# Patient Record
Sex: Female | Born: 1990 | Hispanic: Yes | Marital: Married | Smoking: Never smoker
Health system: Southern US, Community
[De-identification: ages and names within clinical notes are randomized; demographics above are authoritative.]

---

## 2019-02-09 ENCOUNTER — Other Ambulatory Visit (HOSPITAL_COMMUNITY): Payer: Self-pay | Admitting: *Deleted

## 2019-02-09 DIAGNOSIS — N644 Mastodynia: Secondary | ICD-10-CM

## 2019-03-26 ENCOUNTER — Telehealth (HOSPITAL_COMMUNITY): Payer: Self-pay | Admitting: *Deleted

## 2019-03-26 NOTE — Telephone Encounter (Signed)
Telephoned patient at home number. Confirmed appointment for March 31. No symptoms of COVID-19. No travel outside of [place] in last 14 days. No contact with someone with a confirmed diagnosis of COVID-19. Used interpreter Julie Sowell.   

## 2019-03-27 ENCOUNTER — Other Ambulatory Visit: Payer: Self-pay

## 2019-03-27 ENCOUNTER — Ambulatory Visit (HOSPITAL_COMMUNITY)
Admission: RE | Admit: 2019-03-27 | Discharge: 2019-03-27 | Disposition: A | Discharge status: Home / Self Care | Payer: Self-pay | Source: Ambulatory Visit | Attending: Obstetrics and Gynecology | Admitting: Obstetrics and Gynecology

## 2019-03-27 ENCOUNTER — Ambulatory Visit
Admission: RE | Admit: 2019-03-27 | Discharge: 2019-03-27 | Disposition: A | Discharge status: Home / Self Care | Payer: No Typology Code available for payment source | Source: Ambulatory Visit | Attending: Obstetrics and Gynecology | Admitting: Obstetrics and Gynecology

## 2019-03-27 ENCOUNTER — Encounter (HOSPITAL_COMMUNITY): Payer: Self-pay

## 2019-03-27 ENCOUNTER — Other Ambulatory Visit (HOSPITAL_COMMUNITY): Payer: Self-pay | Admitting: *Deleted

## 2019-03-27 VITALS — BP 102/64 | Temp 97.9°F | Wt 133.0 lb

## 2019-03-27 DIAGNOSIS — Z1239 Encounter for other screening for malignant neoplasm of breast: Secondary | ICD-10-CM

## 2019-03-27 DIAGNOSIS — N644 Mastodynia: Secondary | ICD-10-CM

## 2019-03-27 NOTE — Patient Instructions (Signed)
Explained breast self awareness with Carla Salazar. Patient did not need a Pap smear today due to last Pap smear was in May 2018 per patient. Let her know [facility] will cover Pap smears every 3 years unless has a history of abnormal Pap smears. Referred patient to the [facility] for a bilateral breast ultrasound. Appointment scheduled for Tuesday, March 27, 2019 at 1330. Patient aware of appointment and will be there. [name] verbalized understanding.  [name], Kathaleen Maser, RN 2:16 PM

## 2019-03-27 NOTE — Progress Notes (Signed)
Complaints of bilateral breast pain x 3 years that is greater within the left breast. Patient states the pain comes and goes. Patient rates the pain at a 2-3 out of 10.  Pap Smear: Pap smear not completed today. Last Pap smear was in May 2018 in [facility] and normal per patient. Per patient has no history of an abnormal Pap smear. No Pap smear results are in Epic.  Physical exam: Breasts Breasts symmetrical. No skin abnormalities bilateral breasts. No nipple retraction bilateral breasts. No nipple discharge bilateral breasts. No lymphadenopathy. No lumps palpated bilateral breasts. Complaints of bilateral outer lower quadrant breast tenderness on exam. Referred patient to the [facility] for a bilateral breast ultrasound. Appointment scheduled for Tuesday, March 27, 2019 at 1330.        Pelvic/Bimanual No Pap smear completed today since last Pap smear and HPV typing was in May 2018 per patient. Pap smear not indicated per BCCCP guidelines.   Smoking History: Patient has never smoked.  Patient Navigation: Patient education provided. Access to services provided for patient through [facility] program. Spanish interpreter provided.   Breast and Cervical Cancer Risk Assessment: Patient has no family history of breast cancer, known genetic mutations, or radiation treatment to the chest before age 51. Patient has no history of cervical dysplasia, immunocompromised, or DES exposure in-utero. Breast cancer risk assessment completed. No breast cancer risk calculated due to patient is less than 45 years old.  Used Spanish interpreter [employer] from [place].

## 2019-05-10 ENCOUNTER — Encounter (HOSPITAL_COMMUNITY): Payer: Self-pay | Admitting: *Deleted

## 2020-07-22 IMAGING — US ULTRASOUND LEFT BREAST LIMITED
1 series · 4 of 4 positions shown · non-contrast
Comparison: None.

CLINICAL DATA: Patient describes generalized bilateral breast pain,
intermittent on the RIGHT and persistent on the LEFT.

EXAM:
ULTRASOUND OF THE LEFT BREAST

[Series 1: ultrasound left breast limited · 0.06mm/px · 4 of 4 slices shown]
[im 1/4]
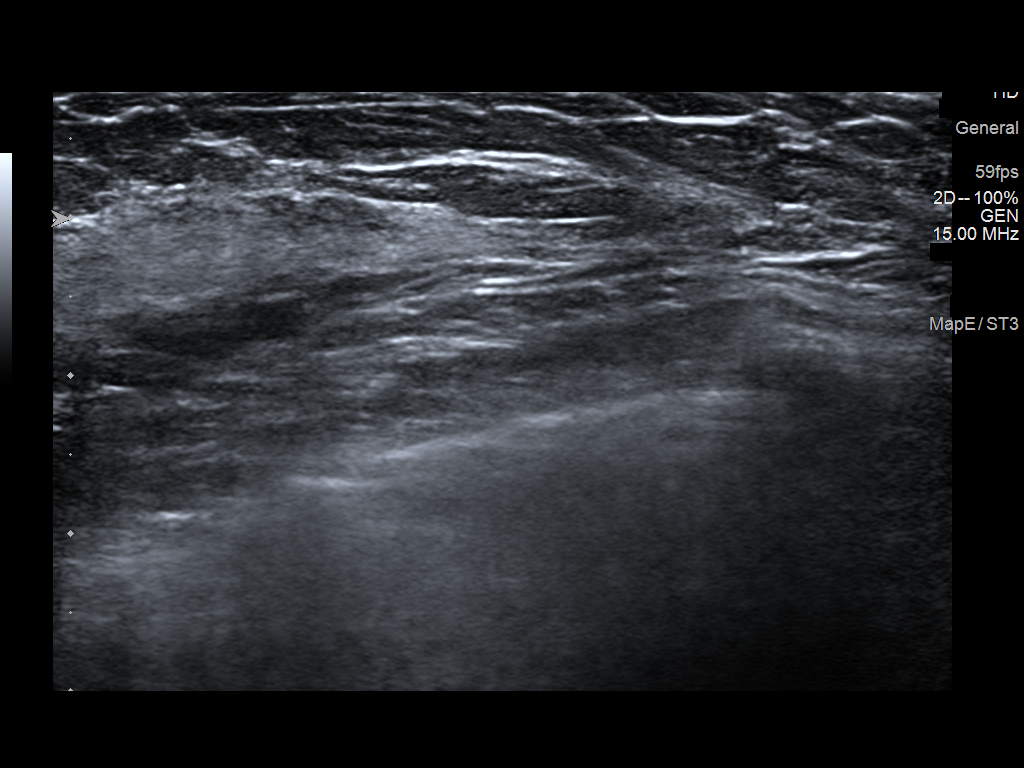
[im 2/4]
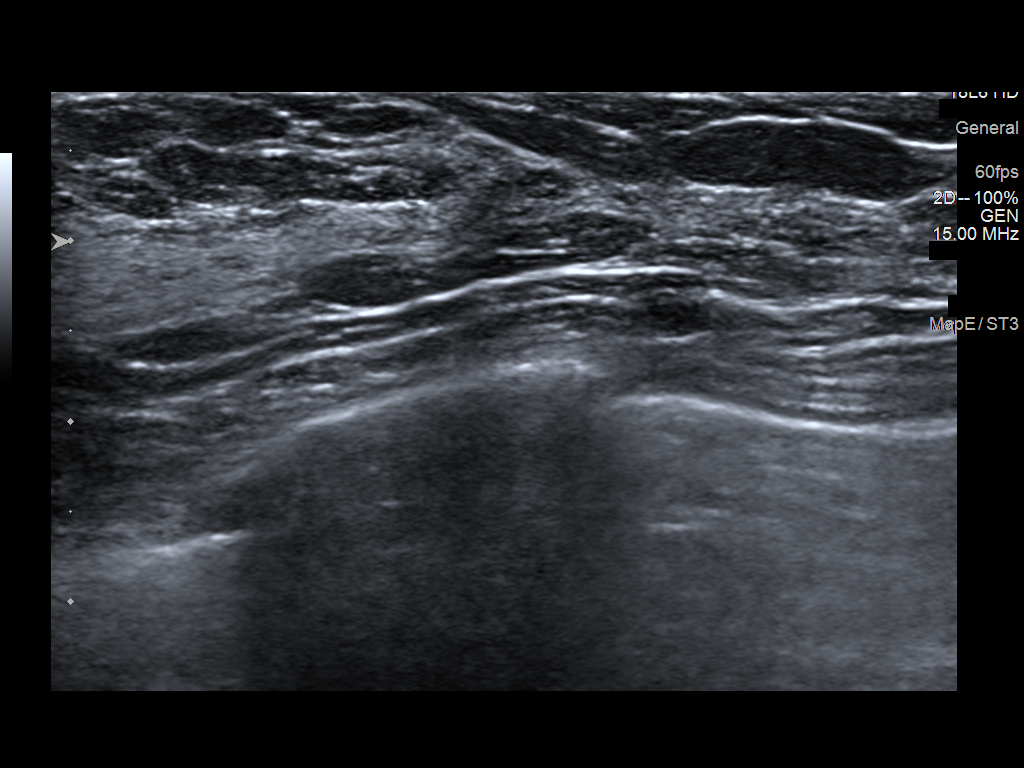
[im 3/4]
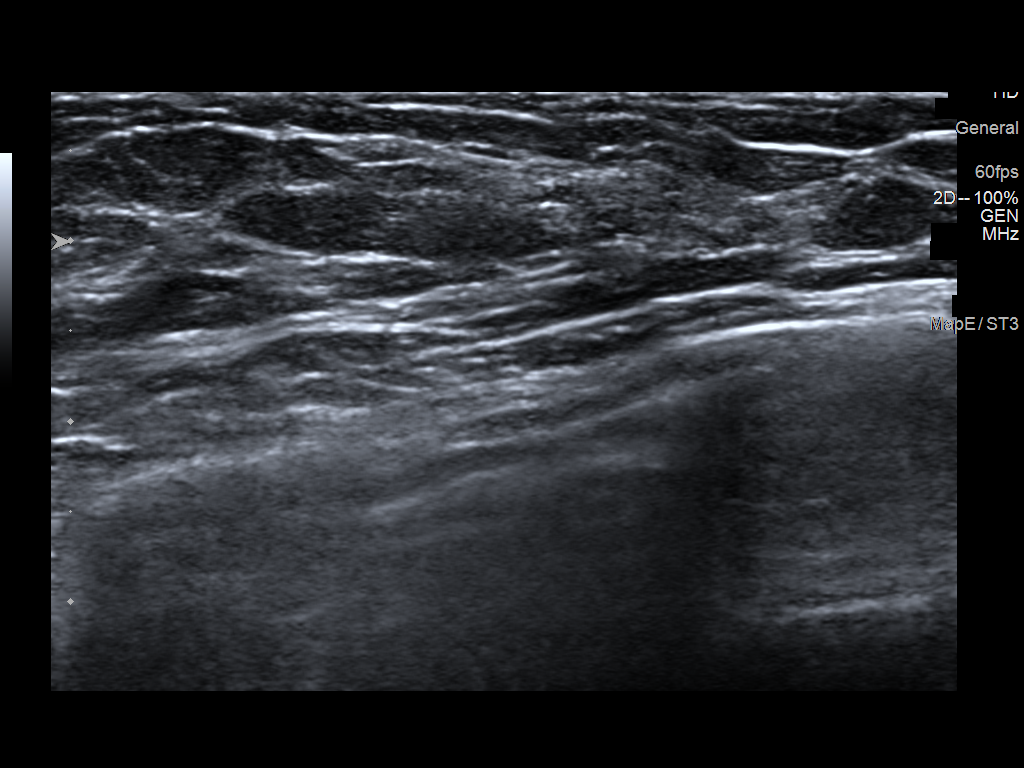
[im 4/4]
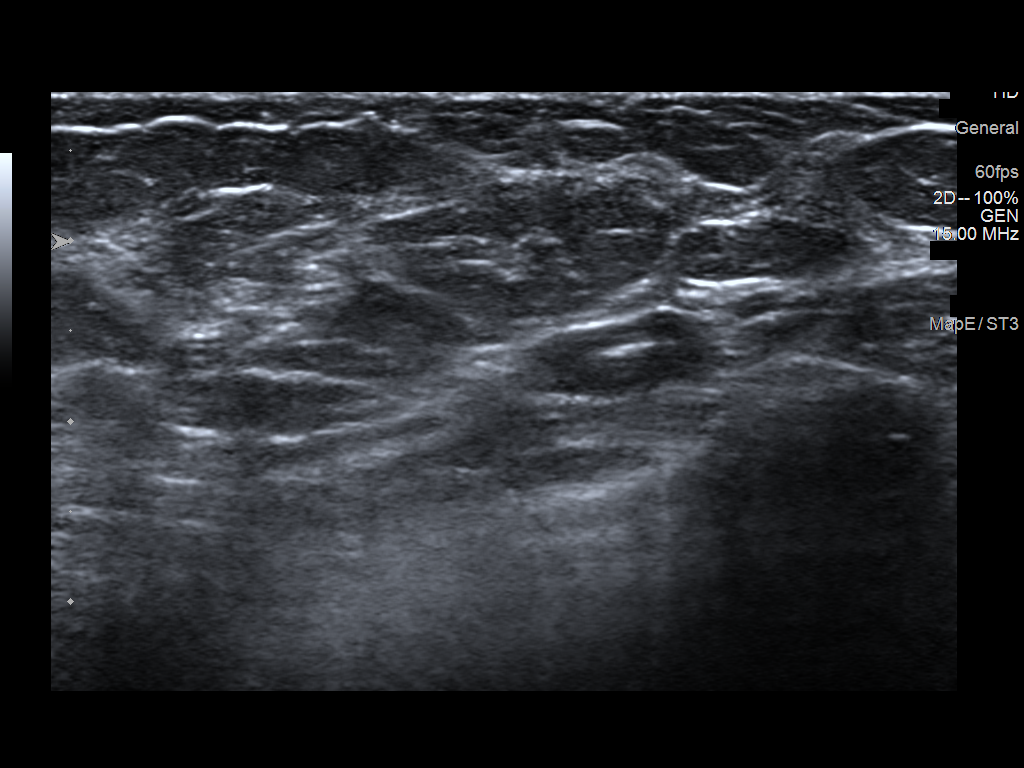

[4 of 4 positions shown; findings below may reference images not displayed]

FINDINGS: Targeted ultrasound is performed, evaluating the outer and lower
LEFT breast as directed by the patient, showing only normal
fibroglandular tissues and fat lobules throughout. No solid or
cystic mass. No skin thickening or evidence of soft tissue edema.
IMPRESSION: No evidence of malignancy or acute findings within the outer or
lower LEFT breast, corresponding to areas of breast pain as directed
by the patient.

RECOMMENDATION:
1. Screening mammogram at age 41 unless there are persistent or
intervening clinical concerns. (Code:S6-0-VSE)
2. Benign causes of breast pain were discussed with the patient.
Patient was encouraged to follow-up with referring physician if pain
became localized and persistent or if a palpable lump/mass
developed.

I have discussed the findings and recommendations with the patient.
Results were also provided in writing at the conclusion of the
visit. If applicable, a reminder letter will be sent to the patient
regarding the next appointment.

BI-RADS CATEGORY  1: Negative.
